# Patient Record
Sex: Male | Born: 1950 | Race: Asian | Hispanic: No | Marital: Married
Health system: Southern US, Community
[De-identification: ages and names within clinical notes are randomized; demographics above are authoritative.]

---

## 2016-08-16 ENCOUNTER — Ambulatory Visit (HOSPITAL_COMMUNITY)
Admission: AD | Admit: 2016-08-16 | Discharge: 2016-08-16 | Disposition: A | Discharge status: Home / Self Care | Payer: No Typology Code available for payment source | Source: Other Acute Inpatient Hospital | Attending: Student in an Organized Health Care Education/Training Program | Admitting: Student in an Organized Health Care Education/Training Program

## 2016-08-16 ENCOUNTER — Emergency Department
Admission: EM | Admit: 2016-08-16 | Discharge: 2016-08-16 | Disposition: A | Discharge status: Nursing Home (Includes ICF) | Payer: No Typology Code available for payment source | Attending: Student in an Organized Health Care Education/Training Program | Admitting: Student in an Organized Health Care Education/Training Program

## 2016-08-16 ENCOUNTER — Emergency Department: Payer: No Typology Code available for payment source

## 2016-08-16 ENCOUNTER — Encounter: Payer: Self-pay | Admitting: Emergency Medicine

## 2016-08-16 DIAGNOSIS — Y92410 Unspecified street and highway as the place of occurrence of the external cause: Secondary | ICD-10-CM | POA: Diagnosis not present

## 2016-08-16 DIAGNOSIS — X58XXXA Exposure to other specified factors, initial encounter: Secondary | ICD-10-CM | POA: Insufficient documentation

## 2016-08-16 DIAGNOSIS — S12690A Other displaced fracture of seventh cervical vertebra, initial encounter for closed fracture: Secondary | ICD-10-CM | POA: Insufficient documentation

## 2016-08-16 DIAGNOSIS — S12500A Unspecified displaced fracture of sixth cervical vertebra, initial encounter for closed fracture: Secondary | ICD-10-CM | POA: Insufficient documentation

## 2016-08-16 DIAGNOSIS — S0511XA Contusion of eyeball and orbital tissues, right eye, initial encounter: Secondary | ICD-10-CM | POA: Insufficient documentation

## 2016-08-16 DIAGNOSIS — S2222XA Fracture of body of sternum, initial encounter for closed fracture: Secondary | ICD-10-CM

## 2016-08-16 DIAGNOSIS — Y999 Unspecified external cause status: Secondary | ICD-10-CM | POA: Insufficient documentation

## 2016-08-16 DIAGNOSIS — Y9389 Activity, other specified: Secondary | ICD-10-CM | POA: Insufficient documentation

## 2016-08-16 DIAGNOSIS — S12590A Other displaced fracture of sixth cervical vertebra, initial encounter for closed fracture: Secondary | ICD-10-CM

## 2016-08-16 DIAGNOSIS — R109 Unspecified abdominal pain: Secondary | ICD-10-CM | POA: Insufficient documentation

## 2016-08-16 DIAGNOSIS — S63266A Dislocation of metacarpophalangeal joint of right little finger, initial encounter: Secondary | ICD-10-CM | POA: Insufficient documentation

## 2016-08-16 DIAGNOSIS — T07XXXA Unspecified multiple injuries, initial encounter: Secondary | ICD-10-CM

## 2016-08-16 DIAGNOSIS — S0990XA Unspecified injury of head, initial encounter: Secondary | ICD-10-CM | POA: Diagnosis present

## 2016-08-16 LAB — TROPONIN I: Troponin I: <0.03 ng/mL (ref <0.03)

## 2016-08-16 LAB — LIPASE, BLOOD: LIPASE: 33 U/L (ref 11–51)

## 2016-08-16 LAB — CBC WITH DIFFERENTIAL/PLATELET
Basophils Absolute: 0 10*3/uL (ref 0–0.1)
Basophils Relative: 0 %
EOS PCT: 0 %
Eosinophils Absolute: 0 10*3/uL (ref 0–0.7)
HEMATOCRIT: 45 % (ref 40.0–52.0)
Hemoglobin: 14.2 g/dL (ref 13.0–18.0)
LYMPHS ABS: 3.4 10*3/uL (ref 1.0–3.6)
LYMPHS PCT: 25 %
MCH: 22.7 pg — AB (ref 26.0–34.0)
MCHC: 31.7 g/dL — ABNORMAL LOW (ref 32.0–36.0)
MCV: 71.7 fL — AB (ref 80.0–100.0)
MONO ABS: 0.9 10*3/uL (ref 0.2–1.0)
Monocytes Relative: 6 %
Neutro Abs: 9.4 10*3/uL — ABNORMAL HIGH (ref 1.4–6.5)
Neutrophils Relative %: 69 %
PLATELETS: 211 10*3/uL (ref 150–440)
RBC: 6.27 MIL/uL — AB (ref 4.40–5.90)
RDW: 15.2 % — ABNORMAL HIGH (ref 11.5–14.5)
WBC: 13.7 10*3/uL — AB (ref 3.8–10.6)

## 2016-08-16 LAB — COMPREHENSIVE METABOLIC PANEL
ALT: 29 U/L (ref 17–63)
AST: 60 U/L — ABNORMAL HIGH (ref 15–41)
Albumin: 3.5 g/dL (ref 3.5–5.0)
Alkaline Phosphatase: 37 U/L — ABNORMAL LOW (ref 38–126)
Anion gap: 8 (ref 5–15)
BILIRUBIN TOTAL: 0.4 mg/dL (ref 0.3–1.2)
BUN: 16 mg/dL (ref 6–20)
CALCIUM: 8.5 mg/dL — AB (ref 8.9–10.3)
CHLORIDE: 107 mmol/L (ref 101–111)
CO2: 22 mmol/L (ref 22–32)
CREATININE: 0.95 mg/dL (ref 0.61–1.24)
Glucose, Bld: 144 mg/dL — ABNORMAL HIGH (ref 65–99)
Potassium: 4.3 mmol/L (ref 3.5–5.1)
Sodium: 137 mmol/L (ref 135–145)
TOTAL PROTEIN: 6.3 g/dL — AB (ref 6.5–8.1)

## 2016-08-16 LAB — PROTIME-INR
INR: 1
PROTHROMBIN TIME: 13.2 s (ref 11.4–15.2)

## 2016-08-16 MED ORDER — FENTANYL CITRATE (PF) 100 MCG/2ML IJ SOLN
100.0000 ug | INTRAMUSCULAR | Status: DC | PRN
  Administered 2016-08-16: 100 ug via INTRAVENOUS
  Filled 2016-08-16: qty 2

## 2016-08-16 MED ORDER — IOPAMIDOL (ISOVUE-300) INJECTION 61%
100.0000 mL | Freq: Once | INTRAVENOUS | Status: AC | PRN
  Administered 2016-08-16: 100 mL via INTRAVENOUS

## 2016-08-16 NOTE — ED Notes (Signed)
Spoke with Son who is on his way back from the towing lot. ETA 15 minutes.
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
## 2016-08-16 NOTE — ED Notes (Signed)
[facility]

## 2016-08-16 NOTE — ED Notes (Addendum)
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 Denies any past medical history and is not requesting any pain medication, but patient has been notified that if he wanted pain medication we could provide him with some.Denies any LOC or respiratory distress.

## 2016-08-16 NOTE — ED Notes (Signed)
XR in room at this time. °
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
## 2016-08-16 NOTE — ED Notes (Signed)
Interpreter services Language Line used for mandarin to explain [facility] and sign consent for transfer. Wife at bedside, son enroute.  Dr. Roxan Hockeyobinson explained reason for transfer and radiology results.  Pt and family in agreement for transfer. Explained patient would be going via EMS.
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
